# Patient Record
Sex: Male | Born: 1990 | Race: White | Hispanic: No | Marital: Single | Smoking: Never smoker
Health system: Southern US, Community
[De-identification: ages and names within clinical notes are randomized; demographics above are authoritative.]

## PROBLEM LIST (undated history)

## (undated) DIAGNOSIS — L709 Acne, unspecified: Secondary | ICD-10-CM

## (undated) HISTORY — DX: Acne, unspecified: L70.9

---
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
## 2012-08-18 IMAGING — CR DG HAND COMPLETE 3+V*L*
3 series · 3 of 3 positions shown · non-contrast
Comparison: None.

CLINICAL DATA: Penetrating trauma.  Pain.

LEFT HAND - COMPLETE 3+ VIEW

[x hand ap left]
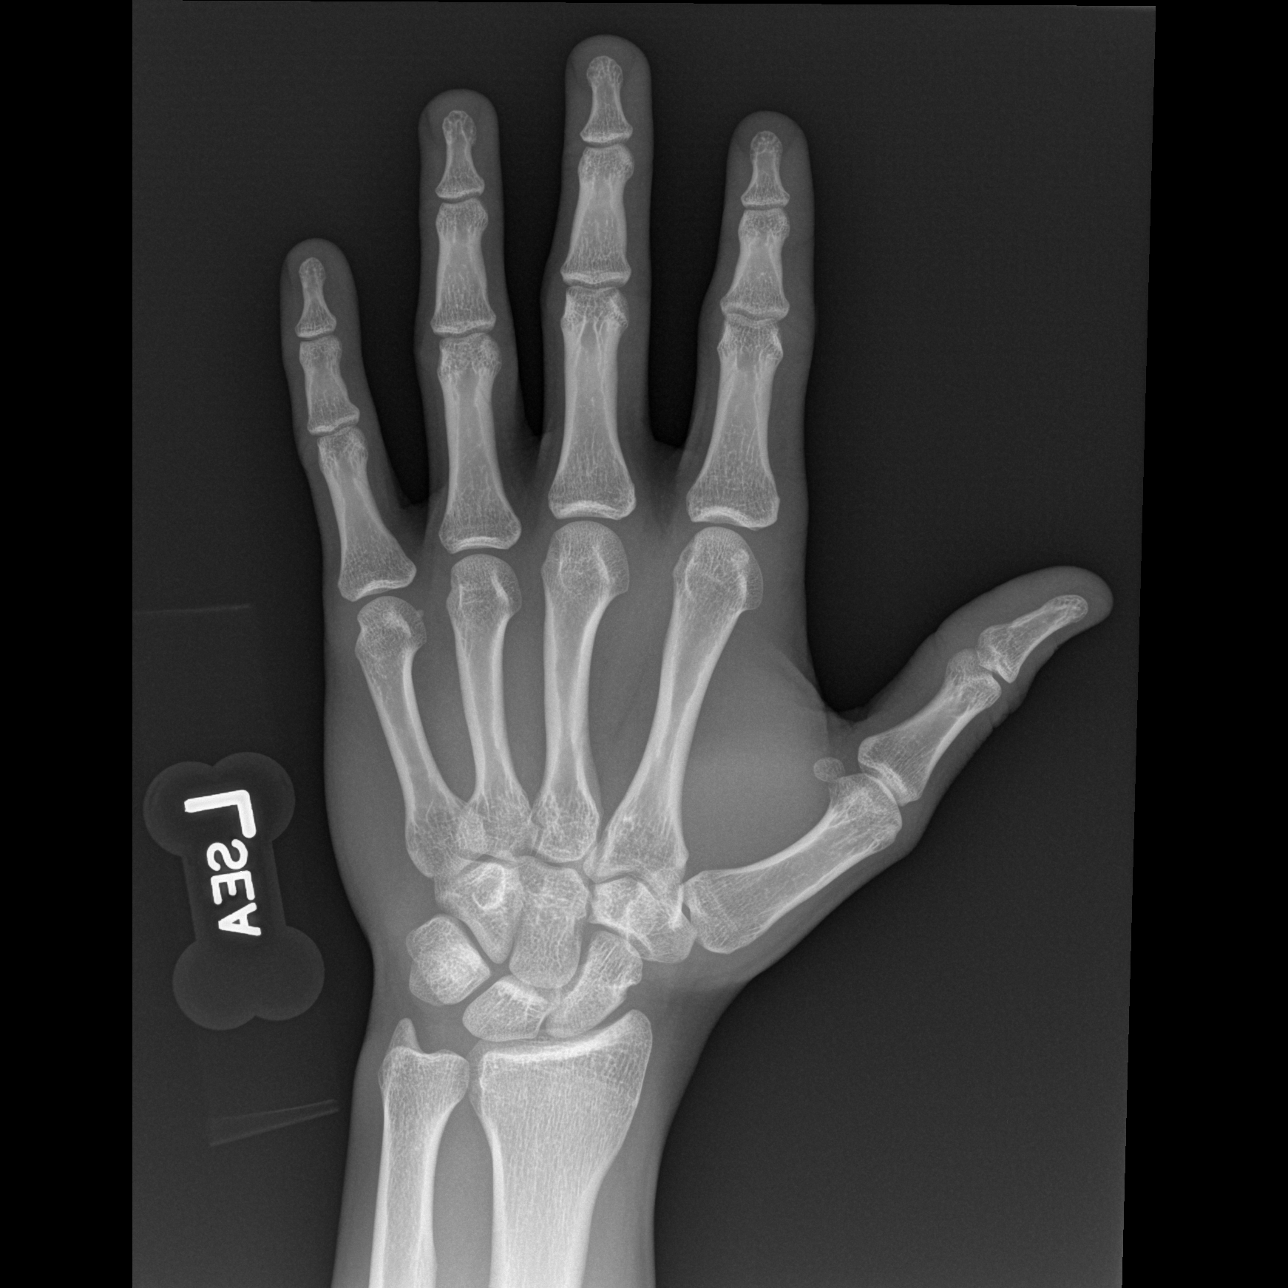

[x hand oblique left]
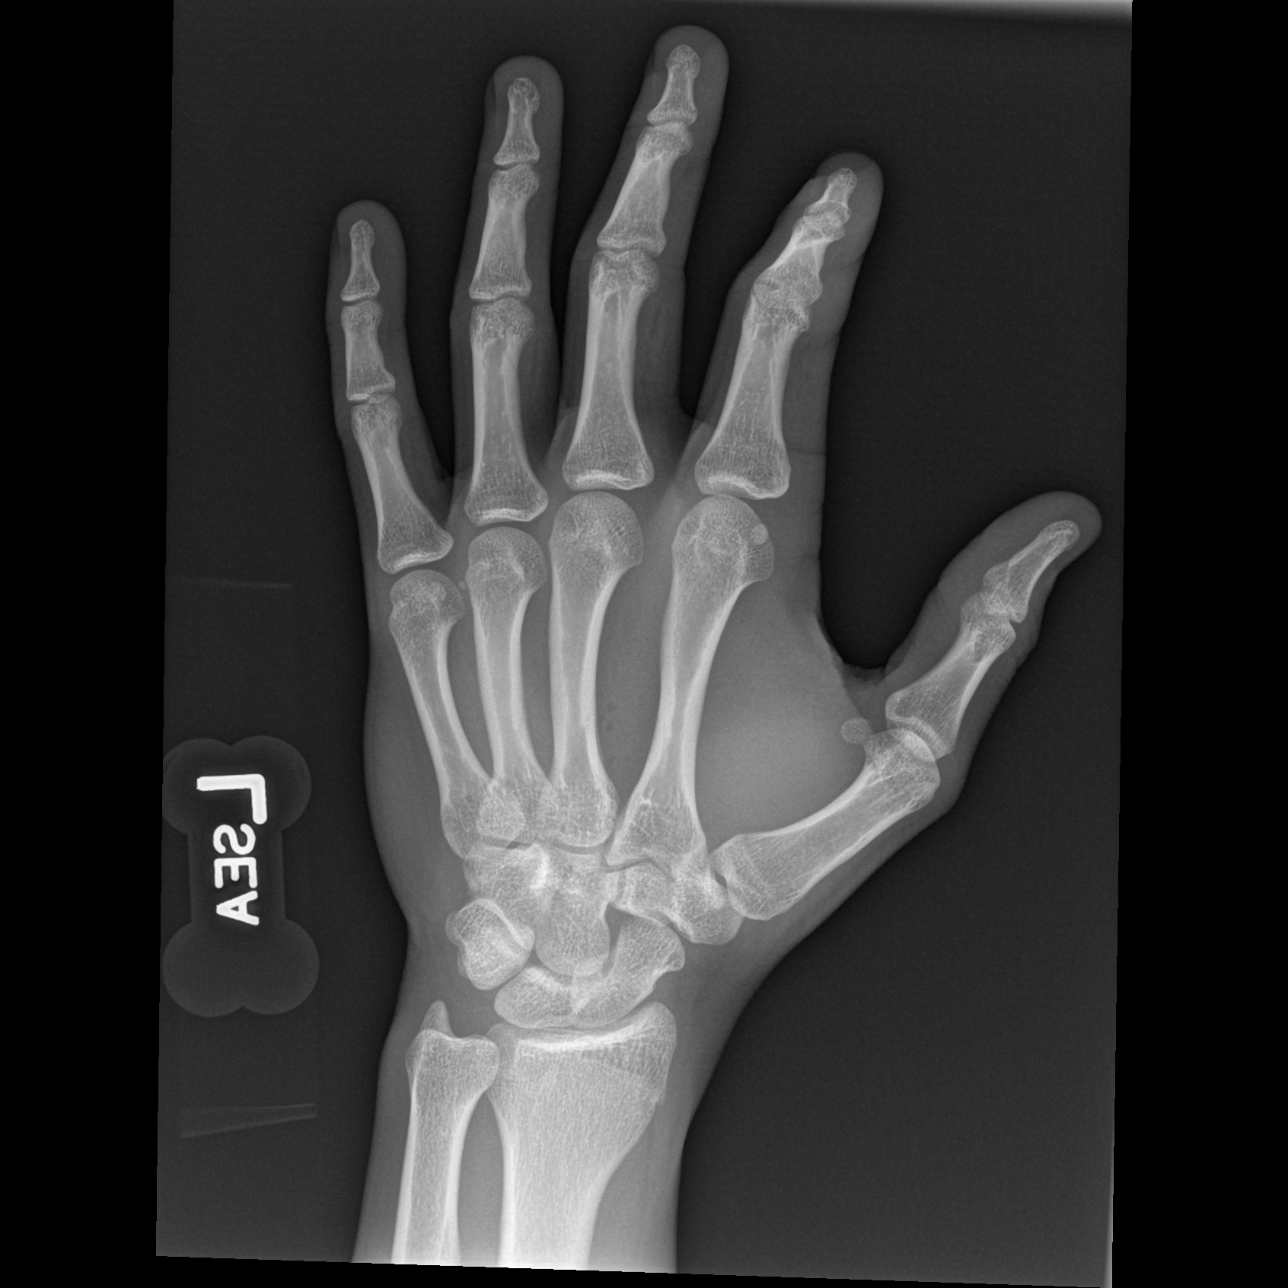

[x hand lat left]
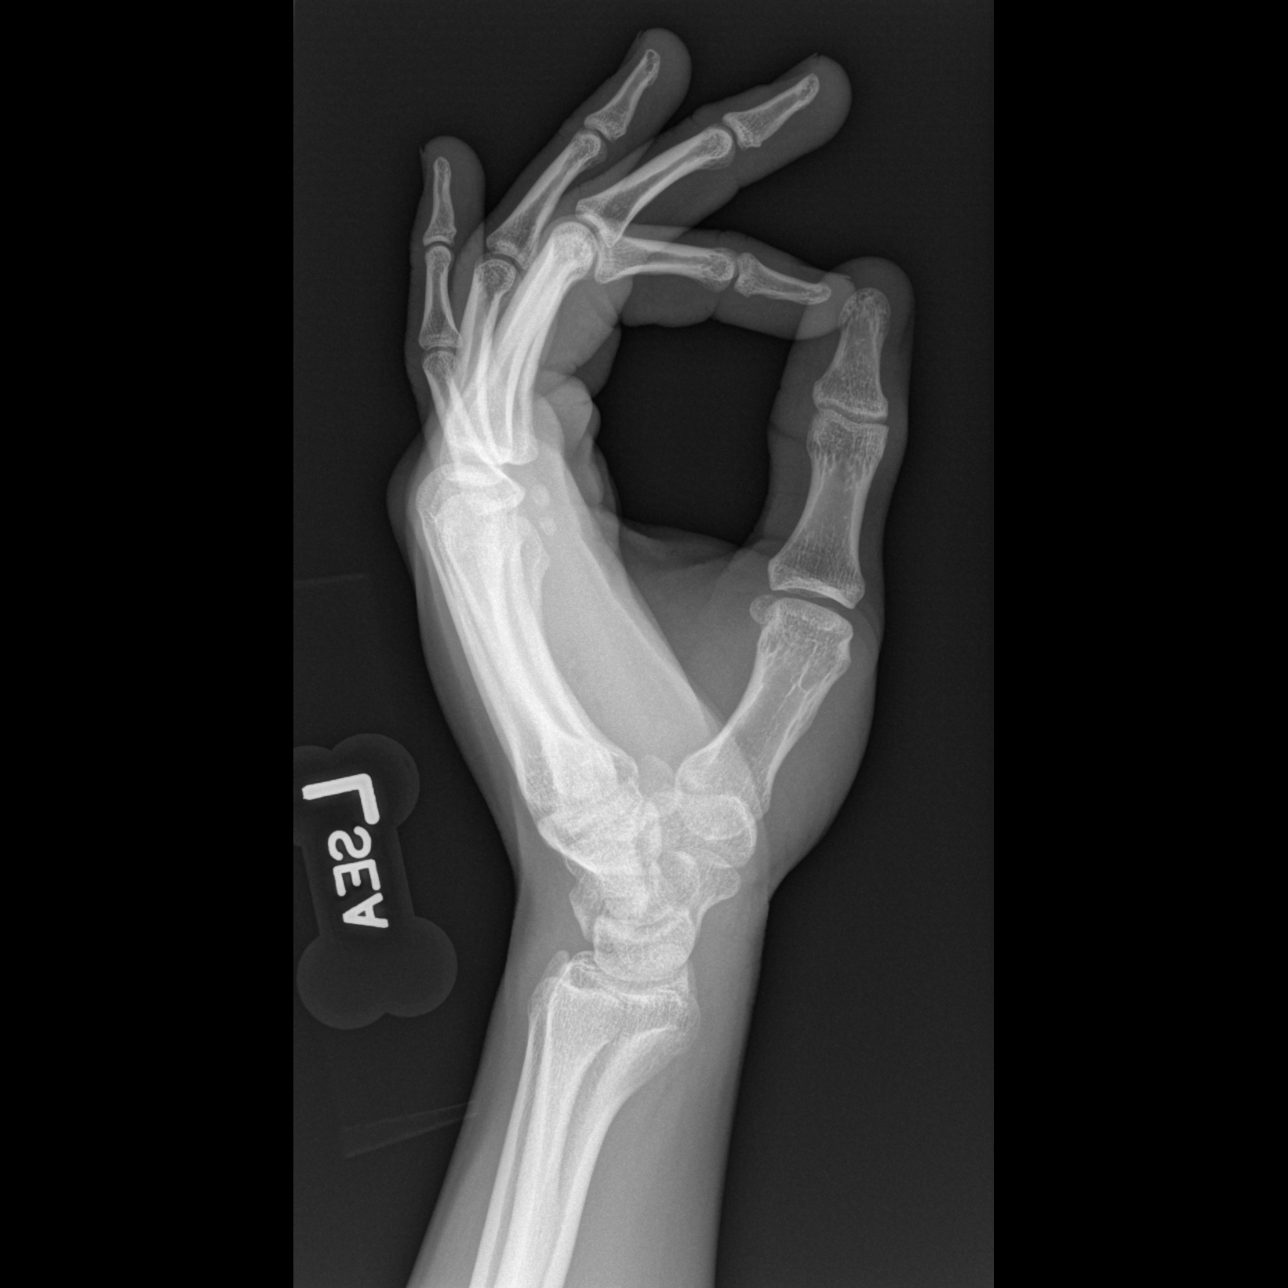

[3 of 3 positions shown; findings below may reference images not displayed]

FINDINGS: There is some gas in the soft tissues at the level of the
base of the third metacarpal.  No fracture, dislocation or
radiopaque foreign body is identified.
IMPRESSION: Findings consistent with laceration.  No foreign body or fracture.

## 2015-08-17 ENCOUNTER — Ambulatory Visit: Payer: Self-pay

## 2015-08-19 ENCOUNTER — Ambulatory Visit (INDEPENDENT_AMBULATORY_CARE_PROVIDER_SITE_OTHER): Payer: Managed Care, Other (non HMO) | Admitting: Urgent Care

## 2015-08-19 VITALS — BP 114/70 | HR 73 | Temp 98.6°F | Resp 16 | Ht 68.5 in | Wt 160.0 lb

## 2015-08-19 DIAGNOSIS — A084 Viral intestinal infection, unspecified: Secondary | ICD-10-CM | POA: Diagnosis not present

## 2015-08-19 DIAGNOSIS — R1084 Generalized abdominal pain: Secondary | ICD-10-CM | POA: Diagnosis not present

## 2015-08-19 DIAGNOSIS — H6982 Other specified disorders of Eustachian tube, left ear: Secondary | ICD-10-CM

## 2015-08-19 DIAGNOSIS — G44209 Tension-type headache, unspecified, not intractable: Secondary | ICD-10-CM

## 2015-08-19 DIAGNOSIS — Z91048 Other nonmedicinal substance allergy status: Secondary | ICD-10-CM

## 2015-08-19 DIAGNOSIS — R197 Diarrhea, unspecified: Secondary | ICD-10-CM | POA: Diagnosis not present

## 2015-08-19 DIAGNOSIS — Z9109 Other allergy status, other than to drugs and biological substances: Secondary | ICD-10-CM

## 2015-08-19 LAB — POCT URINALYSIS DIP (MANUAL ENTRY)
BILIRUBIN UA: NEGATIVE
GLUCOSE UA: NEGATIVE
Ketones, POC UA: NEGATIVE
Leukocytes, UA: NEGATIVE
Nitrite, UA: NEGATIVE
Protein Ur, POC: NEGATIVE
RBC UA: NEGATIVE
SPEC GRAV UA: 1.01
Urobilinogen, UA: 0.2
pH, UA: 6.5

## 2015-08-19 LAB — POCT CBC
Granulocyte percent: 57.6 %G (ref 37–80)
HCT, POC: 45.8 % (ref 43.5–53.7)
HEMOGLOBIN: 16 g/dL (ref 14.1–18.1)
LYMPH, POC: 2.8 (ref 0.6–3.4)
MCH, POC: 30.7 pg (ref 27–31.2)
MCHC: 34.8 g/dL (ref 31.8–35.4)
MCV: 88.3 fL (ref 80–97)
MID (cbc): 0.7 (ref 0–0.9)
MPV: 6.3 fL (ref 0–99.8)
PLATELET COUNT, POC: 244 10*3/uL (ref 142–424)
POC Granulocyte: 4.7 (ref 2–6.9)
POC LYMPH PERCENT: 34.2 %L (ref 10–50)
POC MID %: 8.2 %M (ref 0–12)
RBC: 5.19 M/uL (ref 4.69–6.13)
RDW, POC: 12.2 %
WBC: 8.1 10*3/uL (ref 4.6–10.2)

## 2015-08-19 MED ORDER — NAPROXEN SODIUM 550 MG PO TABS: 550.0000 mg | ORAL_TABLET | Freq: Two times a day (BID) | ORAL | Status: AC

## 2015-08-19 NOTE — Progress Notes (Signed)
MRN: [id number] DOB: Mar 25, 1990  Subjective:   Jeffrey Barr is a 25 y.o. male presenting for chief complaint of Headache; Abdominal Pain; and Diarrhea  Reports 4 day history of diarrhea (<10 BM/day), nausea with vomiting, general belly pain, decreased appetite, intermittent headache. Symptoms of belly pain, diarrhea and vomiting are improving. His headache is left sided toward the back of his head, constant, is achy and pulsating in nature. Patient is worried that he has a serious condition since he went swimming and started to have a headache thereafter. Has tried otc medications, APAP with minimal relief. Admits history of seasonal allergies, has not taken Claritin for 1 month, is not currently using his Flonase. Denies fever, ear pain, sore throat, sinus pain, rashes, bloody stools, confusion. Denies smoking cigarettes.  [place] currently has no medications in their medication list. Also has No Known Allergies.  Jerol  has a past medical history of Acne. Also  has past surgical history that includes Myringotomy (1994).   His family history includes Hypertension in his father and mother.   Objective:   Vitals: BP 114/70 mmHg  Pulse 73  Temp(Src) 98.6 F (37 C)  Resp 16  Ht 5' 8.5" (1.74 m)  Wt 160 lb (72.576 kg)  BMI 23.97 kg/m2  SpO2 99%  Physical Exam  Constitutional: He is oriented to person, place, and time. He appears well-developed and well-nourished.  HENT:  TM's intact bilaterally, no effusions or erythema. Nasal turbinates pink and moist, nasal passages patent. No sinus tenderness. Oropharynx clear, mucous membranes moist, dentition in good repair.  Eyes: Right eye exhibits no discharge. Left eye exhibits no discharge. No scleral icterus.  Neck: Normal range of motion. Neck supple.  Cardiovascular: Normal rate, regular rhythm and intact distal pulses.  Exam reveals no gallop and no friction rub.   No murmur heard. Pulmonary/Chest: No respiratory distress. He has  no wheezes. He has no rales.  Abdominal: Soft. Bowel sounds are normal. He exhibits no distension and no mass. There is no tenderness.  Lymphadenopathy:    He has no cervical adenopathy.  Neurological: He is alert and oriented to person, place, and time. No cranial nerve deficit.  Skin: Skin is warm and dry.   Results for orders placed or performed in visit on 08/19/15 (from the past 24 hour(s))  POCT urinalysis dipstick     Status: None   Collection Time: 08/19/15  1:52 PM  Result Value Ref Range   Color, UA yellow yellow   Clarity, UA clear clear   Glucose, UA negative negative   Bilirubin, UA negative negative   Ketones, POC UA negative negative   Spec Grav, UA 1.010    Blood, UA negative negative   pH, UA 6.5    Protein Ur, POC negative negative   Urobilinogen, UA 0.2    Nitrite, UA Negative Negative   Leukocytes, UA Negative Negative  POCT CBC     Status: None   Collection Time: 08/19/15  2:04 PM  Result Value Ref Range   WBC 8.1 4.6 - 10.2 K/uL   Lymph, poc 2.8 0.6 - 3.4   POC LYMPH PERCENT 34.2 10 - 50 %L   MID (cbc) 0.7 0 - 0.9   POC MID % 8.2 0 - 12 %M   POC Granulocyte 4.7 2 - 6.9   Granulocyte percent 57.6 37 - 80 %G   RBC 5.19 4.69 - 6.13 M/uL   Hemoglobin 16.0 14.1 - 18.1 g/dL   HCT, POC 45.8  43.5 - 53.7 %   MCV 88.3 80 - 97 fL   MCH, POC 30.7 27 - 31.2 pg   MCHC 34.8 31.8 - 35.4 g/dL   RDW, POC 60.412.2 %   Platelet Count, POC 244 142 - 424 K/uL   MPV 6.3 0 - 99.8 fL   Assessment and Plan :   1. Viral gastroenteritis 2. Generalized abdominal pain 3. Diarrhea, unspecified type - Improving, anticipatory guidance provided. RTC in 1 week if no improvement.  4. Tension-type headache, not intractable, unspecified chronicity pattern 5. Environmental allergies 6. Eustachian tube dysfunction - Advised patient to restart Claritin and Flonase for allergies, ETD. He can use Anaprox as needed for headache which I suspect is related to this and dehydration associated  with his viral gastroenteritis. RTC in 1 week if problem persists.  Wallis BambergMario Erland Vivas, PA-C [facility] [phone] 08/19/2015 1:41 PM

## 2015-08-19 NOTE — Patient Instructions (Addendum)
Viral Gastroenteritis Viral gastroenteritis is also known as stomach flu. This condition affects the stomach and intestinal tract. It can cause sudden diarrhea and vomiting. The illness typically lasts 3 to 8 days. Most people develop an immune response that eventually gets rid of the virus. While this natural response develops, the virus can make you quite ill. CAUSES  Many different viruses can cause gastroenteritis, such as rotavirus or noroviruses. You can catch one of these viruses by consuming contaminated food or water. You may also catch a virus by sharing utensils or other personal items with an infected person or by touching a contaminated surface. SYMPTOMS  The most common symptoms are diarrhea and vomiting. These problems can cause a severe loss of body fluids (dehydration) and a body salt (electrolyte) imbalance. Other symptoms may include:  Fever.  Headache.  Fatigue.  Abdominal pain. DIAGNOSIS  Your caregiver can usually diagnose viral gastroenteritis based on your symptoms and a physical exam. A stool sample may also be taken to test for the presence of viruses or other infections. TREATMENT  This illness typically goes away on its own. Treatments are aimed at rehydration. The most serious cases of viral gastroenteritis involve vomiting so severely that you are not able to keep fluids down. In these cases, fluids must be given through an intravenous line (IV). HOME CARE INSTRUCTIONS   Drink enough fluids to keep your urine clear or pale yellow. Drink small amounts of fluids frequently and increase the amounts as tolerated.  Ask your caregiver for specific rehydration instructions.  Avoid:  Foods high in sugar.  Alcohol.  Carbonated drinks.  Tobacco.  Juice.  Caffeine drinks.  Extremely hot or cold fluids.  Fatty, greasy foods.  Too much intake of anything at one time.  Dairy products until 24 to 48 hours after diarrhea stops.  You may consume probiotics.  Probiotics are active cultures of beneficial bacteria. They may lessen the amount and number of diarrheal stools in adults. Probiotics can be found in yogurt with active cultures and in supplements.  Wash your hands well to avoid spreading the virus.  Only take over-the-counter or prescription medicines for pain, discomfort, or fever as directed by your caregiver. Do not give aspirin to children. Antidiarrheal medicines are not recommended.  Ask your caregiver if you should continue to take your regular prescribed and over-the-counter medicines.  Keep all follow-up appointments as directed by your caregiver. SEEK IMMEDIATE MEDICAL CARE IF:   You are unable to keep fluids down.  You do not urinate at least once every 6 to 8 hours.  You develop shortness of breath.  You notice blood in your stool or vomit. This may look like coffee grounds.  You have abdominal pain that increases or is concentrated in one small area (localized).  You have persistent vomiting or diarrhea.  You have a fever.  The patient is a child younger than 3 months, and he or she has a fever.  The patient is a child older than 3 months, and he or she has a fever and persistent symptoms.  The patient is a child older than 3 months, and he or she has a fever and symptoms suddenly get worse.  The patient is a baby, and he or she has no tears when crying. MAKE SURE YOU:   Understand these instructions.  Will watch your condition.  Will get help right away if you are not doing well or get worse.   This information is not intended to replace  advice given to you by your health care provider. Make sure you discuss any questions you have with your health care provider.   Document Released: 01/17/2005 Document Revised: 04/11/2011 Document Reviewed: 11/03/2010 Elsevier Interactive Patient Education 2016 [employer]     Tension Headache A tension headache is a feeling of pain, pressure, or aching that is  often felt over the front and sides of the head. The pain can be dull, or it can feel tight (constricting). Tension headaches are not normally associated with nausea or vomiting, and they do not get worse with physical activity. Tension headaches can last from 30 minutes to several days. This is the most common type of headache. CAUSES The exact cause of this condition is not known. Tension headaches often begin after stress, anxiety, or depression. Other triggers may include:  Alcohol.  Too much caffeine, or caffeine withdrawal.  Respiratory infections, such as colds, flu, or sinus infections.  Dental problems or teeth clenching.  Fatigue.  Holding your head and neck in the same position for a long period of time, such as while using a computer.  Smoking. SYMPTOMS Symptoms of this condition include:  A feeling of pressure around the head.  Dull, aching head pain.  Pain felt over the front and sides of the head.  Tenderness in the muscles of the head, neck, and shoulders. DIAGNOSIS This condition may be diagnosed based on your symptoms and a physical exam. Tests may be done, such as a CT scan or an MRI of your head. These tests may be done if your symptoms are severe or unusual. TREATMENT This condition may be treated with lifestyle changes and medicines to help relieve symptoms. HOME CARE INSTRUCTIONS Managing Pain  Take over-the-counter and prescription medicines only as told by your health care provider.  Lie down in a dark, quiet room when you have a headache.  If directed, apply ice to the head and neck area:  Put ice in a plastic bag.  Place a towel between your skin and the bag.  Leave the ice on for 20 minutes, 2-3 times per day.  Use a heating pad or a hot shower to apply heat to the head and neck area as told by your health care provider. Eating and Drinking  Eat meals on a regular schedule.  Limit alcohol use.  Decrease your caffeine intake, or stop  using caffeine. General Instructions  Keep all follow-up visits as told by your health care provider. This is important.  Keep a headache journal to help find out what may trigger your headaches. For example, write down:  What you eat and drink.  How much sleep you get.  Any change to your diet or medicines.  Try massage or other relaxation techniques.  Limit stress.  Sit up straight, and avoid tensing your muscles.  Do not use tobacco products, including cigarettes, chewing tobacco, or e-cigarettes. If you need help quitting, ask your health care provider.  Exercise regularly as told by your health care provider.  Get 7-9 hours of sleep, or the amount recommended by your health care provider. SEEK MEDICAL CARE IF:  Your symptoms are not helped by medicine.  You have a headache that is different from what you normally experience.  You have nausea or you vomit.  You have a fever. SEEK IMMEDIATE MEDICAL CARE IF:  Your headache becomes severe.  You have repeated vomiting.  You have a stiff neck.  You have a loss of vision.  You have problems  with speech.  You have pain in your eye or ear.  You have muscular weakness or loss of muscle control.  You lose your balance or you have trouble walking.  You feel faint or you pass out.  You have confusion.   This information is not intended to replace advice given to you by your health care provider. Make sure you discuss any questions you have with your health care provider.   Document Released: 01/17/2005 Document Revised: 10/08/2014 Document Reviewed: 05/12/2014 Elsevier Interactive Patient Education 2016 [employer]     IF you received an x-ray today, you will [facility] an [facility]. Please contact [facility] at [phone] with questions or concerns regarding your invoice.   IF you received labwork today, you will receive an invoice from [facility]. Please contact [facility] at [phone] with questions or concerns regarding your invoice.   Our billing staff will not be able to assist you with questions regarding bills from these companies.  You will be contacted with the lab results as soon as they are available. The fastest way to get your results is to activate your My Chart account. Instructions are located on the last page of this paperwork. If you have not heard from us regarding the results in 2 weeks, please contact this office.

## 2015-09-11 ENCOUNTER — Ambulatory Visit: Payer: Self-pay | Admitting: Family Medicine

## 2017-11-28 ENCOUNTER — Ambulatory Visit: Payer: Self-pay | Admitting: Internal Medicine

## 2022-09-23 ENCOUNTER — Telehealth: Payer: Self-pay | Admitting: Family Medicine

## 2022-09-23 NOTE — Telephone Encounter (Signed)
Pts father is Dr Pamala Hurry pt and son would like to have him as his PCP. He has heard great things about Dr Durene Cal from his father and would like to come here. Please advise.

## 2022-09-24 NOTE — Telephone Encounter (Signed)
Yes thanks-I am still excepting family members but it may take some time to get him in

## 2023-01-16 ENCOUNTER — Ambulatory Visit: Payer: BC Managed Care – PPO | Admitting: Family Medicine
